# Patient Record
Sex: Male | Born: 1935 | Race: Black or African American | Hispanic: No | Marital: Married | Smoking: Never smoker
Health system: Southern US, Community
[De-identification: ages and names within clinical notes are randomized; demographics above are authoritative.]

## PROBLEM LIST (undated history)

## (undated) DIAGNOSIS — C439 Malignant melanoma of skin, unspecified: Secondary | ICD-10-CM

## (undated) DIAGNOSIS — E785 Hyperlipidemia, unspecified: Secondary | ICD-10-CM

## (undated) DIAGNOSIS — C61 Malignant neoplasm of prostate: Secondary | ICD-10-CM

## (undated) DIAGNOSIS — I10 Essential (primary) hypertension: Secondary | ICD-10-CM

## (undated) DIAGNOSIS — M1990 Unspecified osteoarthritis, unspecified site: Secondary | ICD-10-CM

## (undated) HISTORY — PX: CARDIAC SURGERY: SHX584

---

## 2012-07-11 ENCOUNTER — Emergency Department (HOSPITAL_COMMUNITY)
Admission: EM | Admit: 2012-07-11 | Discharge: 2012-07-11 | Disposition: A | Discharge status: Home / Self Care | Payer: Medicare Other | Attending: Emergency Medicine | Admitting: Emergency Medicine

## 2012-07-11 ENCOUNTER — Emergency Department (HOSPITAL_COMMUNITY): Payer: Medicare Other

## 2012-07-11 ENCOUNTER — Encounter (HOSPITAL_COMMUNITY): Payer: Self-pay | Admitting: Emergency Medicine

## 2012-07-11 DIAGNOSIS — D649 Anemia, unspecified: Secondary | ICD-10-CM

## 2012-07-11 DIAGNOSIS — Z79899 Other long term (current) drug therapy: Secondary | ICD-10-CM | POA: Insufficient documentation

## 2012-07-11 DIAGNOSIS — E785 Hyperlipidemia, unspecified: Secondary | ICD-10-CM | POA: Insufficient documentation

## 2012-07-11 DIAGNOSIS — C61 Malignant neoplasm of prostate: Secondary | ICD-10-CM | POA: Insufficient documentation

## 2012-07-11 DIAGNOSIS — E119 Type 2 diabetes mellitus without complications: Secondary | ICD-10-CM | POA: Insufficient documentation

## 2012-07-11 DIAGNOSIS — J811 Chronic pulmonary edema: Secondary | ICD-10-CM

## 2012-07-11 DIAGNOSIS — Z794 Long term (current) use of insulin: Secondary | ICD-10-CM | POA: Insufficient documentation

## 2012-07-11 DIAGNOSIS — C439 Malignant melanoma of skin, unspecified: Secondary | ICD-10-CM | POA: Insufficient documentation

## 2012-07-11 DIAGNOSIS — Z7982 Long term (current) use of aspirin: Secondary | ICD-10-CM | POA: Insufficient documentation

## 2012-07-11 DIAGNOSIS — I10 Essential (primary) hypertension: Secondary | ICD-10-CM | POA: Insufficient documentation

## 2012-07-11 HISTORY — DX: Malignant melanoma of skin, unspecified: C43.9

## 2012-07-11 HISTORY — DX: Essential (primary) hypertension: I10

## 2012-07-11 HISTORY — DX: Malignant neoplasm of prostate: C61

## 2012-07-11 HISTORY — DX: Hyperlipidemia, unspecified: E78.5

## 2012-07-11 HISTORY — DX: Unspecified osteoarthritis, unspecified site: M19.90

## 2012-07-11 LAB — POCT I-STAT, CHEM 8
Chloride: 105 mEq/L (ref 96–112)
HCT: 24 % — ABNORMAL LOW (ref 39.0–52.0)
Potassium: 3.7 mEq/L (ref 3.5–5.1)

## 2012-07-11 LAB — CBC WITH DIFFERENTIAL/PLATELET
Basophils Absolute: 0 10*3/uL (ref 0.0–0.1)
Basophils Relative: 0 % (ref 0–1)
Hemoglobin: 7.3 g/dL — ABNORMAL LOW (ref 13.0–17.0)
MCHC: 31.9 g/dL (ref 30.0–36.0)
Monocytes Relative: 9 % (ref 3–12)
Neutro Abs: 4.2 10*3/uL (ref 1.7–7.7)
Neutrophils Relative %: 60 % (ref 43–77)

## 2012-07-11 LAB — POCT I-STAT TROPONIN I: Troponin i, poc: 0 ng/mL (ref 0.00–0.08)

## 2012-07-11 LAB — PRO B NATRIURETIC PEPTIDE: Pro B Natriuretic peptide (BNP): 159.3 pg/mL (ref 0–450)

## 2012-07-11 NOTE — ED Provider Notes (Signed)
History     CSN: [id number]  Arrival date & time 07/11/12  0402   First MD Initiated Contact with Patient 07/11/12 [phone]      Chief Complaint  Patient presents with  . Shortness of Breath    (Consider location/radiation/quality/duration/timing/severity/associated sxs/prior treatment) HPI Comments: 77 year old male with a history of prostate cancer and melanoma, hypertension and diabetes who presents with a complaint of shortness of breath and dizziness. The dizziness is poorly described but does not seem to be vertiginous. He states that on Fridays he takes approximately 10 pills before bed, they tend to make him dizzy. In addition to this he took a "shot that makes his blood counts show up". This is after chemotherapy. He denies any fevers or chills nausea or vomiting. He does have shortness of breath with a cough and states that his cough was much worse earlier in the day. At this time he has no shortness of breath, no cough, no fevers nausea or vomiting. He still has a slight dizziness. He denies the room spinning. The patient denies any diarrhea, rectal bleeding, dysuria but does admit to having bilateral lower extremity swelling for the last several days. His family Dr. and chemotherapy are given in [facility].  Patient is a 77 y.o. male presenting with shortness of breath. The history is provided by the patient, the spouse and a relative.  Shortness of Breath  Associated symptoms include shortness of breath.    Past Medical History  Diagnosis Date  . Prostate cancer   . Melanoma   . Hypertension   . Diabetes mellitus   . Hyperlipidemia   . Arthritis     History reviewed. No pertinent past surgical history.  History reviewed. No pertinent family history.  History  Substance Use Topics  . Smoking status: Never Smoker   . Smokeless tobacco: Not on file  . Alcohol Use: No      Review of Systems  Respiratory: Positive for shortness of breath.   All other  systems reviewed and are negative.    Allergies  Penicillins  Home Medications   Current Outpatient Rx  Name Route Sig Dispense Refill  . ACYCLOVIR 200 MG PO CAPS Oral Take 200 mg by mouth 5 (five) times daily.    Marland Kitchen AMLODIPINE BESYLATE 5 MG PO TABS Oral Take 5 mg by mouth daily.    . ASPIRIN 81 MG PO CHEW Oral Chew 81 mg by mouth daily.    Marland Kitchen DEXAMETHASONE 4 MG PO TABS Oral Take 40 mg by mouth once a week. Take on friday    . FUROSEMIDE 40 MG PO TABS Oral Take 40 mg by mouth daily as needed. For swelling    . INSULIN ASPART PROT & ASPART (70-30) 100 UNIT/ML [place] SUSP Subcutaneous Inject 31-36 Units into the skin 2 (two) times daily with a meal. Take 31 units in the morning and 36 units in the evening    . LISINOPRIL-HYDROCHLOROTHIAZIDE 20-12.5 MG PO TABS Oral Take 1 tablet by mouth daily.    Marland Kitchen NAPROXEN 500 MG PO TABS Oral Take 500 mg by mouth 2 (two) times daily with a meal.    . TERAZOSIN HCL 10 MG PO CAPS Oral Take 10 mg by mouth 2 (two) times daily.    . TRAMADOL HCL 50 MG PO TABS Oral Take 50 mg by mouth every 6 (six) hours as needed.      BP 117/60  Pulse 79  Temp 97.8 F (36.6 C) (Oral)  Resp  20  SpO2 95%  Physical Exam  Nursing note and vitals reviewed. Constitutional: He appears well-developed and well-nourished. No distress.  HENT:  Head: Normocephalic and atraumatic.  Mouth/Throat: Oropharynx is clear and moist. No oropharyngeal exudate.  Eyes: Conjunctivae normal and EOM are normal. Pupils are equal, round, and reactive to light. Right eye exhibits no discharge. Left eye exhibits no discharge. No scleral icterus.  Neck: Normal range of motion. Neck supple. No JVD present. No thyromegaly present.  Cardiovascular: Normal rate, regular rhythm and intact distal pulses.  Exam reveals no gallop and no friction rub.   Murmur (moderate systolic murmur) heard. Pulmonary/Chest: Effort normal and breath sounds normal. No respiratory distress. He has no wheezes. He has no rales.         Lungs are clear without wheezing rales or increased respiratory rate  Abdominal: Soft. Bowel sounds are normal. He exhibits no distension and no mass. There is no tenderness.  Musculoskeletal: Normal range of motion. He exhibits edema (bilateral 1+ pitting edema, no asymmetry). He exhibits no tenderness.  Lymphadenopathy:    He has no cervical adenopathy.  Neurological: He is alert. Coordination normal.  Skin: Skin is warm and dry. No rash noted. No erythema.  Psychiatric: He has a normal mood and affect. His behavior is normal.    ED Course  Procedures (including critical care time)  Labs Reviewed  CBC WITH DIFFERENTIAL - Abnormal; Notable for the following:    RBC 2.69 (*)     Hemoglobin 7.3 (*)     HCT 22.9 (*)     RDW 19.0 (*)     Eosinophils Relative 6 (*)     All other components within normal limits  POCT I-STAT, CHEM 8 - Abnormal; Notable for the following:    Glucose, Bld 139 (*)     Hemoglobin 8.2 (*)     HCT 24.0 (*)     All other components within normal limits  PRO B NATRIURETIC PEPTIDE  POCT I-STAT TROPONIN I   Dg Chest 2 View  07/11/2012  *RADIOLOGY REPORT*  Clinical Data: Weakness, dizzy  CHEST - 2 VIEW  Comparison: None.  Findings: Mild cardiomegaly.  Mild interstitial edema.  No effusion.  There is some fluid in or thickening of the interlobar fissures.  Regional bones unremarkable.  IMPRESSION:  Mild cardiomegaly and mild interstitial edema without effusion.   Original Report Authenticated By: Osa Craver, M.D.      1. Pulmonary edema   2. Anemia       MDM  At this time the patient has vital signs consistent with oxygen of 93-97% on room air, blood pressure of 146/57 a pulse of 85. The patient is afebrile. Due to his increased dyspnea and swelling of the lower extremities will check a chest x-ray and the BNP to rule out congestive heart failure, laboratory evaluation for hyperglycemia, electrolyte abnormalities. EKG is totally normal,  symptoms may be related to the medications that he took this evening. He denies shortness of breath at this time. ED ECG REPORT  I personally interpreted this EKG   Date: 07/11/2012   Rate: 82  Rhythm: normal sinus rhythm  QRS Axis: normal  Intervals: normal  ST/T Wave abnormalities: normal  Conduction Disutrbances:none  Narrative Interpretation:   Old EKG Reviewed: none available   I have reevaluated the patient after lab work and chest x-rays have been reported. His blood pressure has reduced to 117/60 spontaneously without medications. His pulses remained at 70-80 and his  respirations are even and unlabored with an oxygen saturation of 95-97% on room air. He does not have any shortness of breath here. The chest x-ray shows mild interstitial edema with may be a slight pleural effusion and his lab work is significant for anemia. I have discussed these findings with the patient and his family members and they state that this is not a new finding and he has been told by his family Dr. that he can be transfused on his return to his home next week. They told him that he does not need it emergently. I have offered the patient the service and he has requested to try to get home to get it done by his family doctors. At this time the patient is hemodynamically stable, he denies any sources of external bleeding or internal bleeding and I suspect that his anemia is related to this ongoing chemotherapy that he is receiving. I have encouraged him to use Lasix once a day for the next 2 days as at this time he only uses it as needed which means he took it once this week. His BNP is in the normal range so I doubt that this is related to congestive heart failure. The patient has been informed that he can return at any time for a repeat evaluation, transfusion etc. and he agrees to followup as needed.      Vida Roller, MD 07/11/12 [phone]

## 2012-07-11 NOTE — ED Notes (Signed)
Pt states that he woke up SOB. Pt states he felt heartburn when he woke up as well. Pt states that this has happened before but not for a long time. Pt denies hx of sleep apnea or CHF. Pt does admit to recent swelling in legs. Pt takes lasix PRN for swelling did not take it in a week. Pt denies CP or heartburn currently.

## 2012-07-11 NOTE — ED Notes (Signed)
Patient reports that after he took his nighttime medications, he fell asleep.  Patient reports that he woke up short of breath; wife drove patient to ED.  Patient is a cancer patient; received last treatment today.  Reports blood pressure at home 218/98; patient reports dizziness.  Denies nausea, vomiting, and chest pain.

## 2013-02-15 IMAGING — CR DG CHEST 2V
2 series · 2 of 2 positions shown · non-contrast
Comparison: None.

CLINICAL DATA: Weakness, dizzy

CHEST - 2 VIEW

[w chest lat]
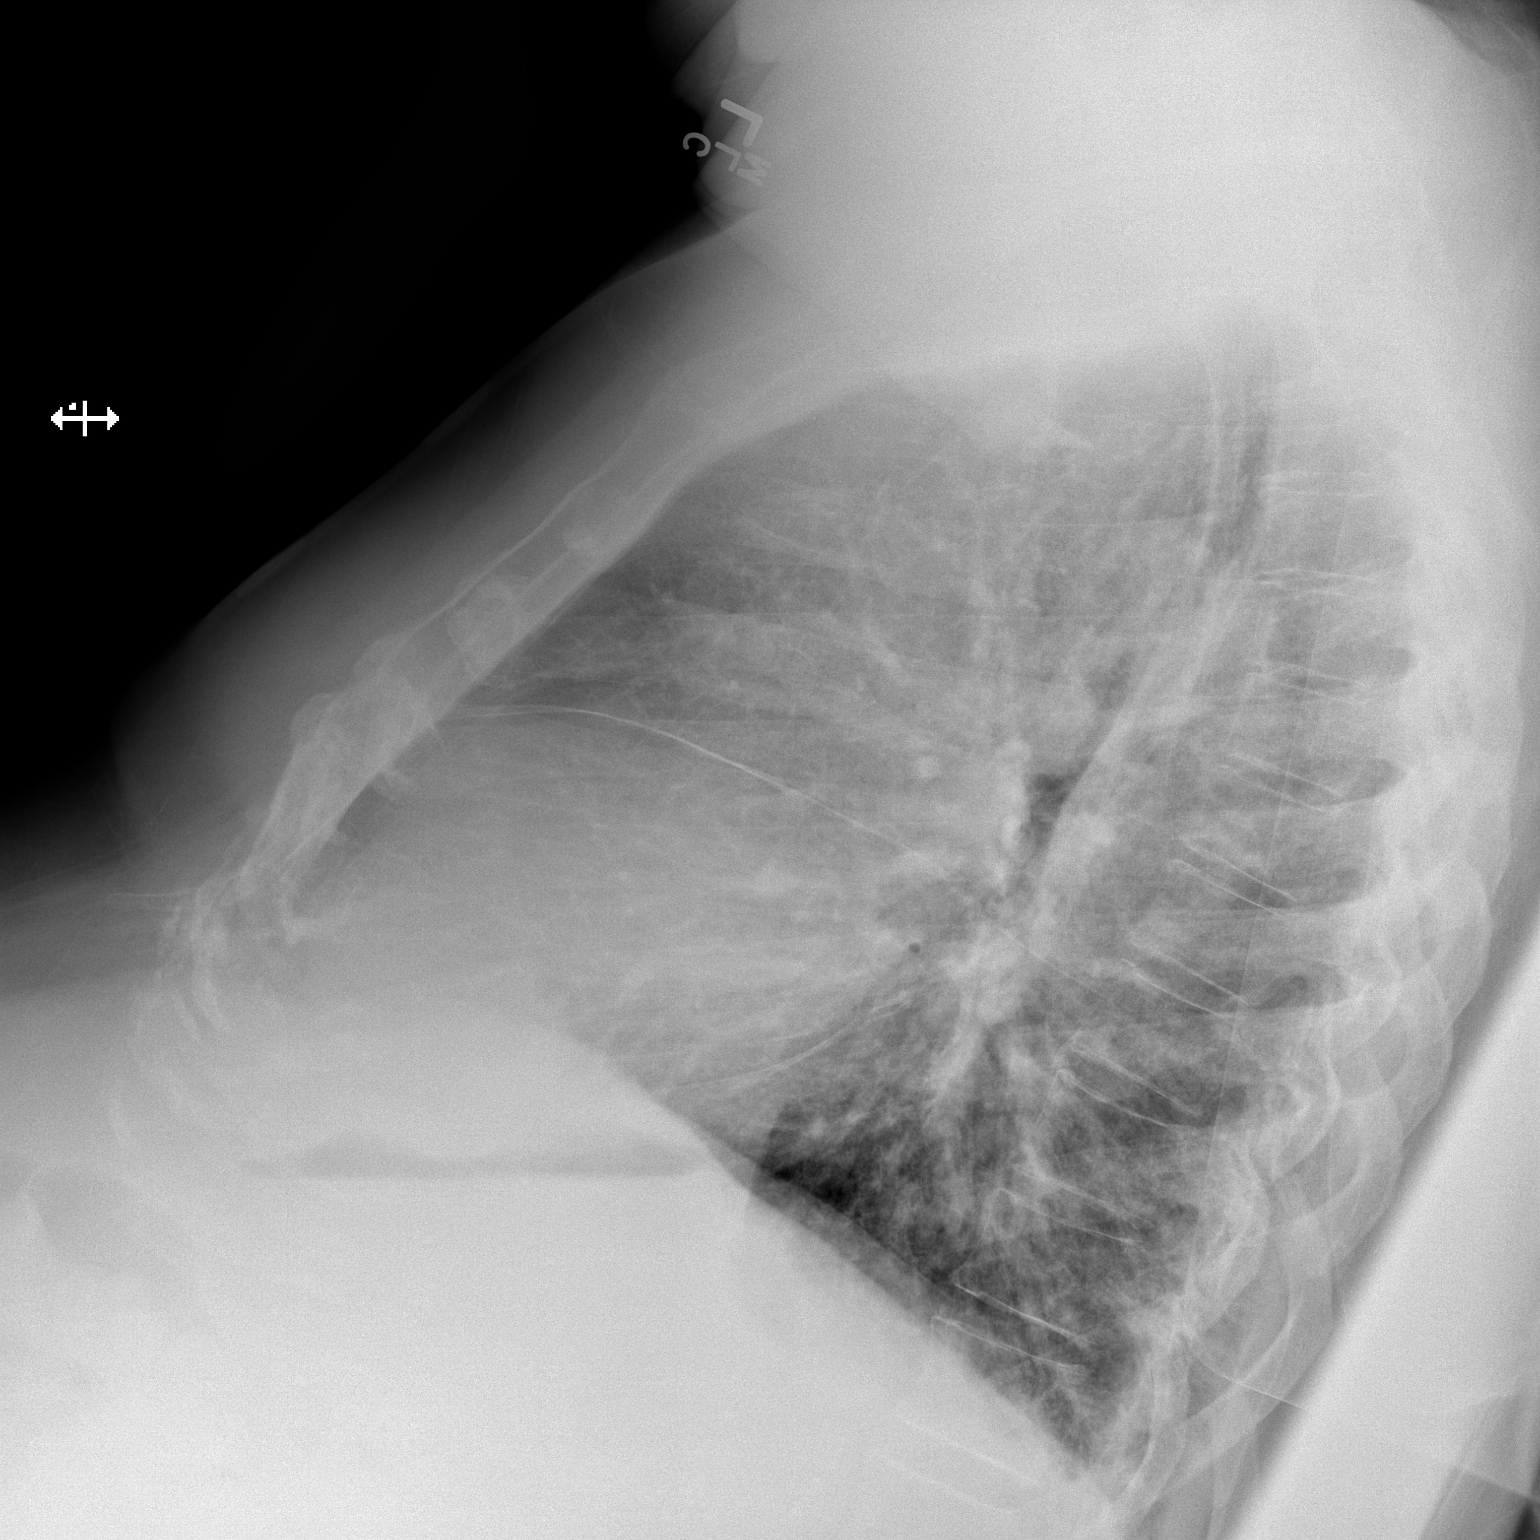

[x chest ap]
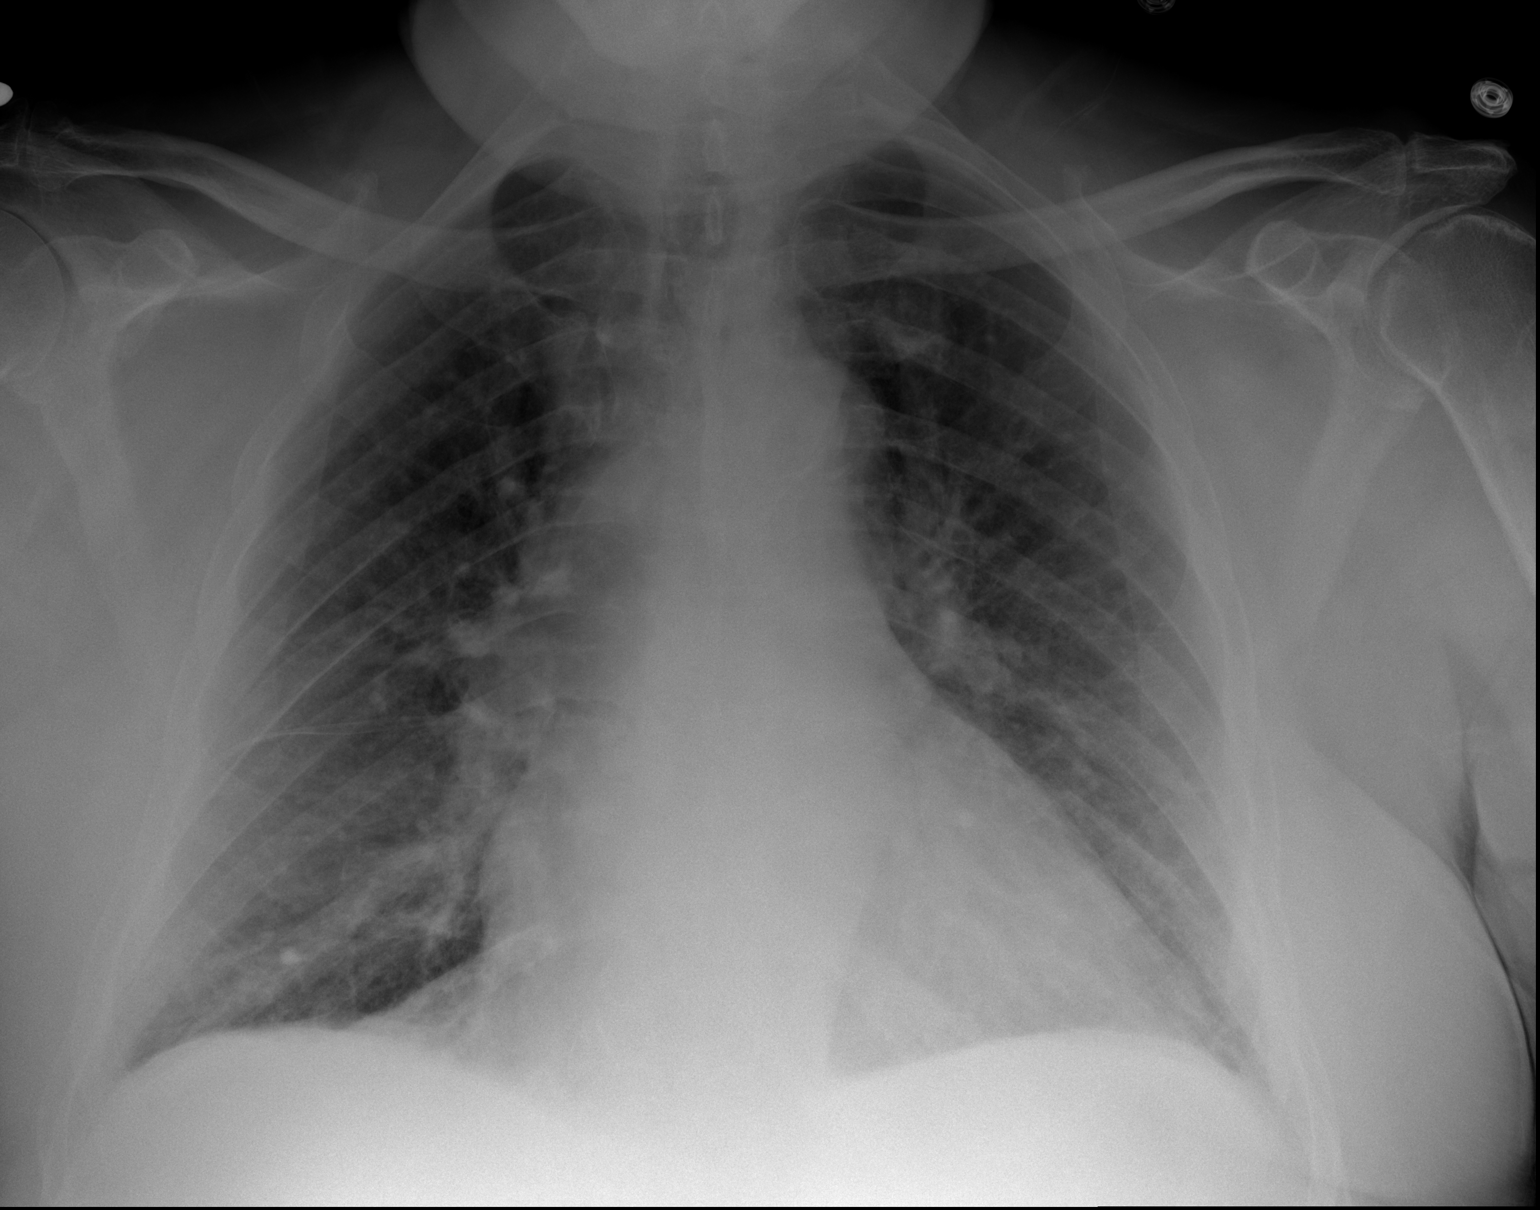

[2 of 2 positions shown; findings below may reference images not displayed]

FINDINGS: Mild cardiomegaly.  Mild interstitial edema.  No
effusion.  There is some fluid in or thickening of the interlobar
fissures.  Regional bones unremarkable.
IMPRESSION: Mild cardiomegaly and mild interstitial edema without effusion.

## 2014-02-22 ENCOUNTER — Emergency Department (INDEPENDENT_AMBULATORY_CARE_PROVIDER_SITE_OTHER)
Admission: EM | Admit: 2014-02-22 | Discharge: 2014-02-22 | Disposition: A | Discharge status: Home / Self Care | Payer: Medicare Other | Source: Home / Self Care | Attending: Emergency Medicine | Admitting: Emergency Medicine

## 2014-02-22 ENCOUNTER — Encounter (HOSPITAL_COMMUNITY): Payer: Self-pay | Admitting: Emergency Medicine

## 2014-02-22 DIAGNOSIS — D649 Anemia, unspecified: Secondary | ICD-10-CM

## 2014-02-22 LAB — CBC WITH DIFFERENTIAL/PLATELET
BASOS ABS: 0.1 10*3/uL (ref 0.0–0.1)
BASOS PCT: 1 % (ref 0–1)
EOS ABS: 0.3 10*3/uL (ref 0.0–0.7)
EOS PCT: 7 % — AB (ref 0–5)
HEMATOCRIT: 27.8 % — AB (ref 39.0–52.0)
HEMOGLOBIN: 9.2 g/dL — AB (ref 13.0–17.0)
Lymphocytes Relative: 41 % (ref 12–46)
Lymphs Abs: 1.5 10*3/uL (ref 0.7–4.0)
MCH: 31.8 pg (ref 26.0–34.0)
MCHC: 33.1 g/dL (ref 30.0–36.0)
MCV: 96.2 fL (ref 78.0–100.0)
MONO ABS: 0.6 10*3/uL (ref 0.1–1.0)
MONOS PCT: 15 % — AB (ref 3–12)
NEUTROS ABS: 1.4 10*3/uL — AB (ref 1.7–7.7)
Neutrophils Relative %: 36 % — ABNORMAL LOW (ref 43–77)
Platelets: 218 10*3/uL (ref 150–400)
RBC: 2.89 MIL/uL — ABNORMAL LOW (ref 4.22–5.81)
RDW: 17.1 % — AB (ref 11.5–15.5)
WBC: 3.7 10*3/uL — ABNORMAL LOW (ref 4.0–10.5)

## 2014-02-22 NOTE — ED Provider Notes (Signed)
Chief Complaint   Chief Complaint  Patient presents with  . Labs Only    History of Present Illness   Damon Thompson is a 79 year old male Who was hospitalized a week ago for coronary artery disease and had stent placement. He had bleeding from the catheterization site in the groin. The patient states that this was not external bleeding but was internal bleeding he states his hemoglobin dropped to 8. He was given some kind of shot for anemia by his oncologist. He is visiting his daughter in [place] this week and his primary care physician wished him to get another CBC this week and to have results faxed to him. His primary care physician is Dr. Vicenta Aly: [phone]. He feels somewhat weak and tired and rundown. He's not taking any Iron supplement. He denies any syncope, shortness of breath, or chest pain. He has a history of multiple myeloma and is on chemotherapy treatments. He did not have any evidence of GI bleeding.  Review of Systems   Other than as noted above, the patient denies any of the following symptoms. Systemic:  No fever, chills, sweats, myalgias, headache, or anorexia. ENT:  No nasal congestion, rhinorrhea, or sore throat. Lungs:  No cough, sputum production, wheezing, shortness of breath. No loud snoring, choking or gasping at night, or unrefreshing sleep. Cardiovascular:  No chest pain, palpitations, or syncope. GI:  No nausea, vomiting, abdominal pain or diarrhea. GU:  No dysuria, frequency, or hematuria. Skin:  No rash or pruritis. Psych:  No history of depression or anxiety.  [place]   Past medical history, family history, social history, meds, and allergies were reviewed.  He's allergic to penicillin. Current meds include amlodipine, aspirin, acyclovir, Decadron, Lasix, NovoLog 70/30, lisinopril/for thiazide, Naprosyn, Hytrin, and Ultram. He has coronary artery disease, myeloma, diabetes, and hypertension.  Physical Examination     Vital signs:  BP 113/41   Pulse 70  Temp(Src) 98.4 F (36.9 C) (Oral)  Resp 18  SpO2 100% General:  Alert, in no distress. Eye:  PERRL, full EOMs.  Lids and conjunctivas were normal. ENT:  TMs and canals were normal, without erythema or inflammation.  Nasal mucosa was clear and uncongested, without drainage.  Mucous membranes were moist.  Pharynx was clear, without exudate or drainage.  There were no oral ulcerations or lesions. Neck:  Supple, no adenopathy, tenderness or mass. Thyroid was normal. Lungs:  No respiratory distress.  Lungs were clear to auscultation, without wheezes, rales or rhonchi.  Breath sounds were clear and equal bilaterally. Heart:  Regular rhythm, without gallops, murmers or rubs. Abdomen:  Soft, flat, and non-tender to palpation.  No hepatosplenomagaly or mass. Skin:  Clear, warm, and dry, without rash or lesions. Psych:  Normal mood and affect.  Labs   Results for orders placed during the hospital encounter of 02/22/14  CBC WITH DIFFERENTIAL      Result Value Ref Range   WBC 3.7 (*) 4.0 - 10.5 K/uL   RBC 2.89 (*) 4.22 - 5.81 MIL/uL   Hemoglobin 9.2 (*) 13.0 - 17.0 g/dL   HCT 27.8 (*) 39.0 - 52.0 %   MCV 96.2  78.0 - 100.0 fL   MCH 31.8  26.0 - 34.0 pg   MCHC 33.1  30.0 - 36.0 g/dL   RDW 17.1 (*) 11.5 - 15.5 %   Platelets 218  150 - 400 K/uL   Neutrophils Relative % 36 (*) 43 - 77 %   Neutro Abs 1.4 (*) 1.7 - 7.7 K/uL  Lymphocytes Relative 41  12 - 46 %   Lymphs Abs 1.5  0.7 - 4.0 K/uL   Monocytes Relative 15 (*) 3 - 12 %   Monocytes Absolute 0.6  0.1 - 1.0 K/uL   Eosinophils Relative 7 (*) 0 - 5 %   Eosinophils Absolute 0.3  0.0 - 0.7 K/uL   Basophils Relative 1  0 - 1 %   Basophils Absolute 0.1  0.0 - 0.1 K/uL    Assessment   The encounter diagnosis was Anemia.  His hemoglobin is coming up and now is 9.2.  Plan     1.  Meds:  The following meds were prescribed:   Discharge Medication List as of 02/22/2014  5:17 PM      2.  Patient Education/Counseling:  The  patient was given appropriate handouts, self care instructions, and instructed in symptomatic relief.  Results of the CBC came back after he had left the facility. I called him and informed him of this result. I also faxed the result to his primary care physician. I suggested an iron supplement. Followup with his primary care physician when he gets back to [place].  3.  Follow up:  The patient was told to follow up here if no better in 3 to 4 days, or sooner if becoming worse in any way, and given some red flag symptoms such as shortness of breath, chest pain, abdominal pain, persistent vomiting, or neurological symptoms which would prompt immediate return.  Follow up here as necessary.     Harden Mo, MD 02/22/14 2012

## 2014-02-22 NOTE — Discharge Instructions (Signed)
Iron Deficiency Anemia, Adult  Anemia is a condition in which there are less red blood cells or hemoglobin in the blood than normal. Hemoglobin is this part of red blood cells that carries oxygen. Iron deficiency anemia is anemia caused by too little iron. It is the most common type of anemia. It may leave you tired and short of breath.  CAUSES    Lack of iron in the diet.   Poor absorption of iron, as seen with intestinal disorders.   Intestinal bleeding.   Heavy periods.  SIGNS AND SYMPTOMS   Mild anemia may not be noticeable. Symptoms may include:   Fatigue.   Headache.   Pale skin.   Weakness.   Tiredness.   Shortness of breath.   Dizziness.   Cold hands and feet.   Fast or irregular heartbeat.  DIAGNOSIS   Diagnosis requires a thorough evaluation and physical exam by your health care provider. Blood tests are generally used to confirm iron deficiency anemia. Additional tests may be done to find the underlying cause of your anemia. These may include:   Testing for blood in the stool (fecal occult blood test).   A procedure to see inside the colon and rectum (colonoscopy).   A procedure to see inside the esophagus and stomach (endoscopy).  TREATMENT   Iron deficiency anemia is treated by correcting the cause of the deficiency. Treatment may involve:   Adding iron-rich foods to your diet.   Taking iron supplements. Pregnant or breastfeeding women need to take extra iron, because their normal diet usually does not provide the required amount.   Taking vitamins. Vitamin C improves the absorption of iron. Your health care provider may recommend taking your iron tablets with a glass of orange juice or vitamin C supplement.   Medicines to make heavy menstrual flow lighter.   Surgery.  HOME CARE INSTRUCTIONS    Take iron as directed by your health care provider.   If you cannot tolerate taking iron supplements by mouth, talk to your health care provider about taking them through a vein  (intravenously) or an injection into a muscle.   For the best iron absorption, iron supplements should be taken on an empty stomach. If you cannot tolerate them on an empty stomach, you may need to take them with food.   Do not drink milk or take antacids at the same time as your iron supplements. Milk and antacids may interfere with the absorption of iron.   Iron supplements can cause constipation. Make sure to include fiber in your diet to prevent constipation. A stool softener may also be recommended.   Take vitamins as directed by your health care provider.   Eat a diet rich in iron. Foods high in iron include liver, lean beef, whole-grain bread, eggs, dried fruit, and dark green, leafy vegetables.  SEEK IMMEDIATE MEDICAL CARE IF:    You faint. If this happens, do not drive. Call your local emergency services (911 in [place] if no other help is available.   You have chest pain.   You feel nauseous or vomit.   You have severe or increased shortness of breath with activity.   You feel weak.   You have a rapid heartbeat.   You have unexplained sweating.   You become lightheaded when getting up from a chair or bed.  MAKE SURE YOU:    Understand these instructions.   Will watch your condition.   Will get help right away if you are not 

## 2014-02-22 NOTE — ED Notes (Signed)
Pt reports PCP called him w/report; low hemoglobin  Wanted pt to have them done; pt is from out of town Sx today include weakness Hx of Myeloma Cancer and had Stents placed last week Alert w/no signs of acuate distress.

## 2017-06-07 DEATH — deceased
